# Patient Record
Sex: Male | Born: 1970 | Race: Black or African American | Hispanic: No | Smoking: Current every day smoker
Health system: Southern US, Community
[De-identification: ages and names within clinical notes are randomized; demographics above are authoritative.]

---

## 2000-01-26 ENCOUNTER — Emergency Department (HOSPITAL_COMMUNITY): Admission: EM | Admit: 2000-01-26 | Discharge: 2000-01-27 | Payer: Self-pay | Admitting: Emergency Medicine

## 2000-01-27 ENCOUNTER — Encounter: Payer: Self-pay | Admitting: Emergency Medicine

## 2000-12-25 ENCOUNTER — Emergency Department (HOSPITAL_COMMUNITY): Admission: EM | Admit: 2000-12-25 | Discharge: 2000-12-26 | Payer: Self-pay | Admitting: Emergency Medicine

## 2002-08-03 ENCOUNTER — Encounter: Payer: Self-pay | Admitting: Emergency Medicine

## 2002-08-03 ENCOUNTER — Emergency Department (HOSPITAL_COMMUNITY): Admission: EM | Admit: 2002-08-03 | Discharge: 2002-08-03 | Payer: Self-pay | Admitting: Emergency Medicine

## 2002-11-17 ENCOUNTER — Encounter: Payer: Self-pay | Admitting: Emergency Medicine

## 2002-11-17 ENCOUNTER — Emergency Department (HOSPITAL_COMMUNITY): Admission: EM | Admit: 2002-11-17 | Discharge: 2002-11-17 | Payer: Self-pay | Admitting: *Deleted

## 2002-12-22 ENCOUNTER — Emergency Department (HOSPITAL_COMMUNITY): Admission: EM | Admit: 2002-12-22 | Discharge: 2002-12-22 | Payer: Self-pay | Admitting: Emergency Medicine

## 2003-06-05 ENCOUNTER — Emergency Department (HOSPITAL_COMMUNITY): Admission: EM | Admit: 2003-06-05 | Discharge: 2003-06-05 | Payer: Self-pay | Admitting: Emergency Medicine

## 2003-08-04 ENCOUNTER — Emergency Department (HOSPITAL_COMMUNITY): Admission: EM | Admit: 2003-08-04 | Discharge: 2003-08-04 | Payer: Self-pay | Admitting: Emergency Medicine

## 2003-08-25 ENCOUNTER — Emergency Department (HOSPITAL_COMMUNITY): Admission: EM | Admit: 2003-08-25 | Discharge: 2003-08-25 | Payer: Self-pay | Admitting: Emergency Medicine

## 2004-01-23 ENCOUNTER — Emergency Department (HOSPITAL_COMMUNITY): Admission: EM | Admit: 2004-01-23 | Discharge: 2004-01-23 | Payer: Self-pay

## 2006-09-27 ENCOUNTER — Emergency Department (HOSPITAL_COMMUNITY): Admission: EM | Admit: 2006-09-27 | Discharge: 2006-09-27 | Payer: Self-pay | Admitting: Emergency Medicine

## 2006-11-24 ENCOUNTER — Emergency Department (HOSPITAL_COMMUNITY): Admission: EM | Admit: 2006-11-24 | Discharge: 2006-11-24 | Payer: Self-pay | Admitting: Emergency Medicine

## 2007-04-27 ENCOUNTER — Emergency Department (HOSPITAL_COMMUNITY): Admission: EM | Admit: 2007-04-27 | Discharge: 2007-04-27 | Payer: Self-pay | Admitting: Emergency Medicine

## 2008-12-12 ENCOUNTER — Emergency Department (HOSPITAL_COMMUNITY): Admission: EM | Admit: 2008-12-12 | Discharge: 2008-12-12 | Payer: Self-pay | Admitting: Emergency Medicine

## 2009-09-02 ENCOUNTER — Emergency Department (HOSPITAL_COMMUNITY): Admission: EM | Admit: 2009-09-02 | Discharge: 2009-09-02 | Payer: Self-pay | Admitting: Emergency Medicine

## 2010-07-16 LAB — URINALYSIS, ROUTINE W REFLEX MICROSCOPIC
Bilirubin Urine: NEGATIVE
Hgb urine dipstick: NEGATIVE
Ketones, ur: NEGATIVE mg/dL
Urobilinogen, UA: 1 mg/dL (ref 0.0–1.0)

## 2010-07-16 LAB — DIFFERENTIAL
Lymphocytes Relative: 28 % (ref 12–46)
Monocytes Absolute: 0.5 10*3/uL (ref 0.1–1.0)
Monocytes Relative: 12 % (ref 3–12)
Neutro Abs: 2.7 10*3/uL (ref 1.7–7.7)
Neutrophils Relative %: 59 % (ref 43–77)

## 2010-07-16 LAB — COMPREHENSIVE METABOLIC PANEL
AST: 20 U/L (ref 0–37)
Albumin: 3.9 g/dL (ref 3.5–5.2)
Alkaline Phosphatase: 65 U/L (ref 39–117)
CO2: 26 mEq/L (ref 19–32)
GFR calc Af Amer: >60 mL/min (ref >60)
GFR calc non Af Amer: >60 mL/min (ref >60)
Potassium: 4.2 mEq/L (ref 3.5–5.1)
Sodium: 138 mEq/L (ref 135–145)
Total Bilirubin: 1.2 mg/dL (ref 0.3–1.2)

## 2010-07-16 LAB — URINE MICROSCOPIC-ADD ON

## 2010-07-16 LAB — CBC
HCT: 40.3 % (ref 39.0–52.0)
MCHC: 33.8 g/dL (ref 30.0–36.0)
MCV: 94.4 fL (ref 78.0–100.0)

## 2010-12-17 IMAGING — CR DG CHEST 2V
2 series · 2 of 2 positions shown · non-contrast
Comparison: 11/24/2006

CLINICAL DATA: Mid chest pain, shortness of breath

CHEST - 2 VIEW

[w chest pa]
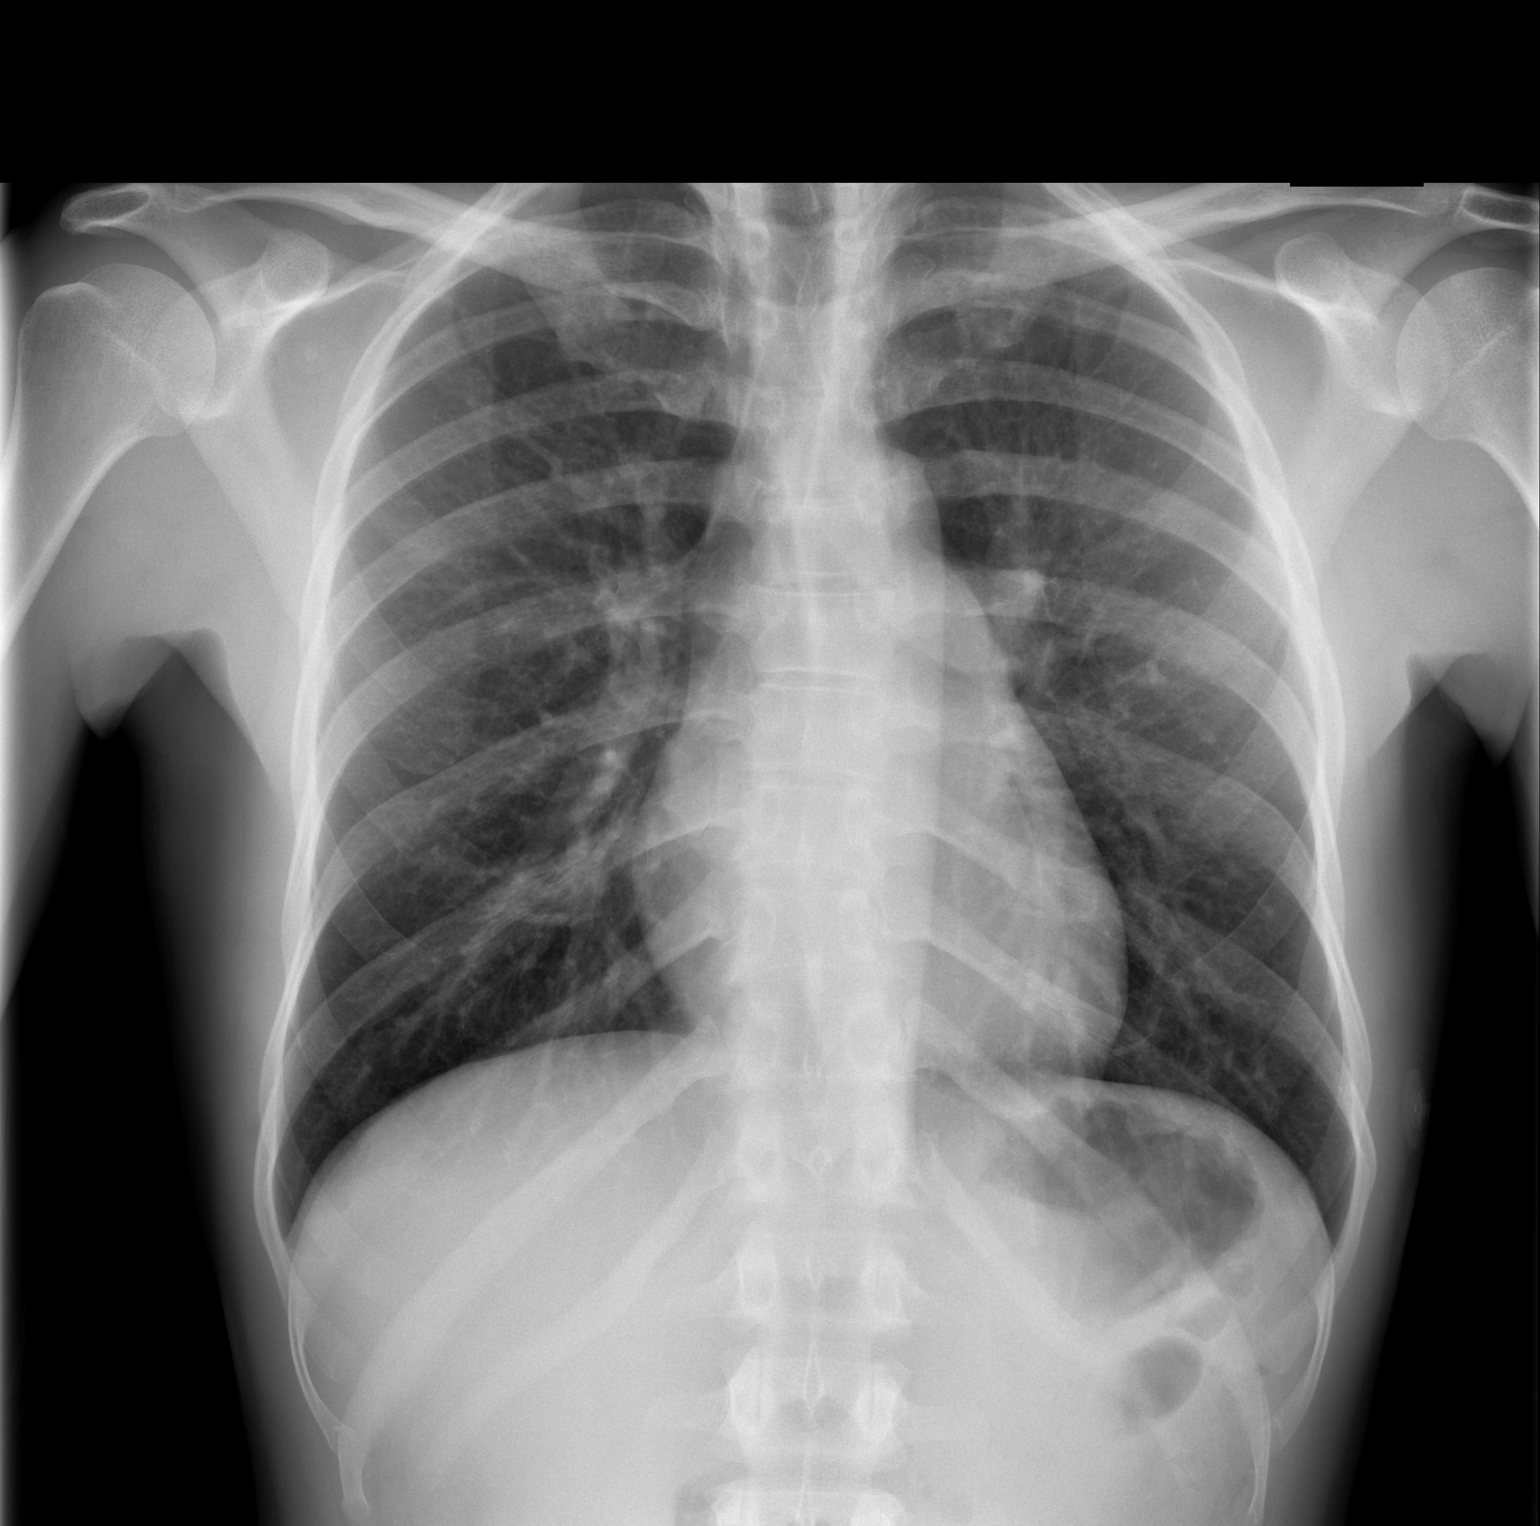

[w chest lat]
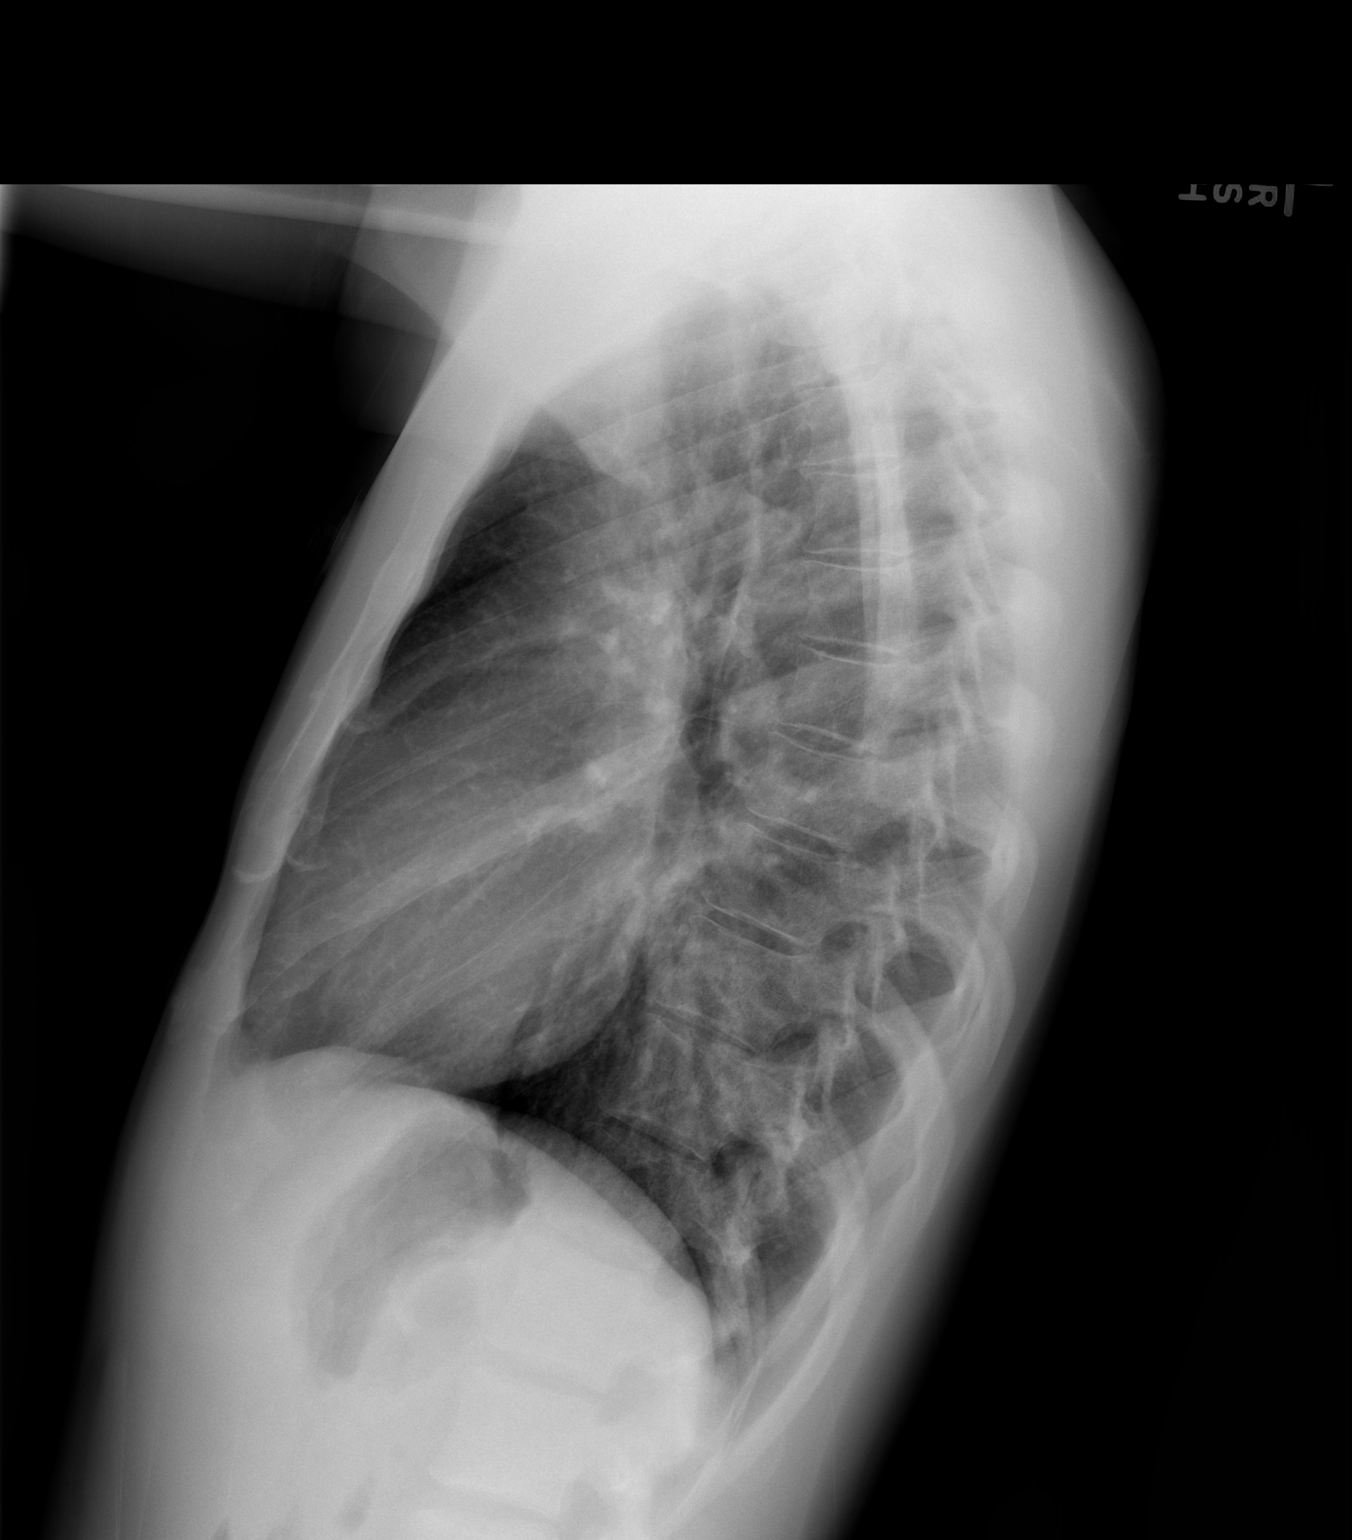

[2 of 2 positions shown; findings below may reference images not displayed]

FINDINGS: Upper normal heart size.
Normal mediastinal contours and pulmonary vascularity.
Minimal chronic peribronchial thickening.
No pulmonary infiltrate, pleural effusion, or pneumothorax.
No acute bony findings.
IMPRESSION: No acute abnormalities.

## 2010-12-30 LAB — DIFFERENTIAL
Eosinophils Relative: 0
Lymphocytes Relative: 43
Lymphs Abs: 2
Neutro Abs: 2.2
Neutrophils Relative %: 48

## 2010-12-30 LAB — COMPREHENSIVE METABOLIC PANEL
ALT: 15
BUN: 10
Chloride: 107
Creatinine, Ser: 1.03
GFR calc non Af Amer: >60
Glucose, Bld: 87
Potassium: 4
Total Bilirubin: 0.7

## 2010-12-30 LAB — URINALYSIS, ROUTINE W REFLEX MICROSCOPIC: Urobilinogen, UA: 1

## 2010-12-30 LAB — CBC
MCV: 90.2
Platelets: 285
RDW: 12.5
WBC: 4.6

## 2010-12-30 LAB — LIPASE, BLOOD: Lipase: 15

## 2011-01-21 LAB — I-STAT 8, (EC8 V) (CONVERTED LAB)
Bicarbonate: 22.6
Chloride: 109
Glucose, Bld: 84
Hemoglobin: 15.3
Operator id: 288831
Potassium: 3.9
Sodium: 144
TCO2: 24
pCO2, Ven: 43.3 — ABNORMAL LOW
pH, Ven: 7.326 — ABNORMAL HIGH

## 2011-01-21 LAB — CBC
HCT: 41.2
Hemoglobin: 14
MCHC: 34
MCV: 89.8
Platelets: 281
RBC: 4.59
RDW: 12.9
WBC: 5.1

## 2011-01-21 LAB — DIFFERENTIAL
Basophils Relative: 1
Eosinophils Absolute: 0
Eosinophils Relative: 0
Lymphocytes Relative: 33
Lymphs Abs: 1.7
Monocytes Relative: 9

## 2011-01-21 LAB — POCT I-STAT CREATININE
Creatinine, Ser: 1.1
Operator id: 288831

## 2011-01-26 LAB — DIFFERENTIAL
Basophils Relative: 2 — ABNORMAL HIGH
Monocytes Relative: 6
Neutro Abs: 4.5
Neutrophils Relative %: 62

## 2011-01-26 LAB — CBC
MCHC: 33.9
RBC: 4.78
WBC: 7.3

## 2011-01-26 LAB — I-STAT 8, (EC8 V) (CONVERTED LAB)
Acid-base deficit: 1
BUN: 11
Chloride: 109
Glucose, Bld: 92
Potassium: 4
pH, Ven: 7.392 — ABNORMAL HIGH

## 2014-10-18 ENCOUNTER — Emergency Department (HOSPITAL_COMMUNITY)
Admission: EM | Admit: 2014-10-18 | Discharge: 2014-10-18 | Disposition: A | Discharge status: Home / Self Care | Payer: Self-pay | Attending: Emergency Medicine | Admitting: Emergency Medicine

## 2014-10-18 ENCOUNTER — Encounter (HOSPITAL_COMMUNITY): Payer: Self-pay | Admitting: *Deleted

## 2014-10-18 DIAGNOSIS — Z720 Tobacco use: Secondary | ICD-10-CM | POA: Insufficient documentation

## 2014-10-18 DIAGNOSIS — L0291 Cutaneous abscess, unspecified: Secondary | ICD-10-CM

## 2014-10-18 DIAGNOSIS — L02219 Cutaneous abscess of trunk, unspecified: Secondary | ICD-10-CM | POA: Insufficient documentation

## 2014-10-18 MED ORDER — LIDOCAINE-EPINEPHRINE (PF) 2 %-1:200000 IJ SOLN
10.0000 mL | Freq: Once | INTRAMUSCULAR | Status: AC
  Administered 2014-10-18: 10 mL
  Filled 2014-10-18: qty 20

## 2014-10-18 NOTE — Discharge Instructions (Signed)
Abscess °An abscess is an infected area that contains a collection of pus and debris. It can occur in almost any part of the body. An abscess is also known as a furuncle or boil. °CAUSES  °An abscess occurs when tissue gets infected. This can occur from blockage of oil or sweat glands, infection of hair follicles, or a minor injury to the skin. As the body tries to fight the infection, pus collects in the area and creates pressure under the skin. This pressure causes pain. People with weakened immune systems have difficulty fighting infections and get certain abscesses more often.  °SYMPTOMS °Usually an abscess develops on the skin and becomes a painful mass that is red, warm, and tender. If the abscess forms under the skin, you may feel a moveable soft area under the skin. Some abscesses break open (rupture) on their own, but most will continue to get worse without care. The infection can spread deeper into the body and eventually into the bloodstream, causing you to feel ill.  °DIAGNOSIS  °Your caregiver will take your medical history and perform a physical exam. A sample of fluid may also be taken from the abscess to determine what is causing your infection. °TREATMENT  °Your caregiver may prescribe antibiotic medicines to fight the infection. However, taking antibiotics alone usually does not cure an abscess. Your caregiver may need to make a small cut (incision) in the abscess to drain the pus. In some cases, gauze is packed into the abscess to reduce pain and to continue draining the area. °HOME CARE INSTRUCTIONS  °· Only take over-the-counter or prescription medicines for pain, discomfort, or fever as directed by your caregiver. °· If you were prescribed antibiotics, take them as directed. Finish them even if you start to feel better. °· If gauze is used, follow your caregiver's directions for changing the gauze. °· To avoid spreading the infection: °· Keep your draining abscess covered with a  bandage. °· Wash your hands well. °· Do not share personal care items, towels, or whirlpools with others. °· Avoid skin contact with others. °· Keep your skin and clothes clean around the abscess. °· Keep all follow-up appointments as directed by your caregiver. °SEEK MEDICAL CARE IF:  °· You have increased pain, swelling, redness, fluid drainage, or bleeding. °· You have muscle aches, chills, or a general ill feeling. °· You have a fever. °MAKE SURE YOU:  °· Understand these instructions. °· Will watch your condition. °· Will get help right away if you are not doing well or get worse. °Document Released: 01/05/2005 Document Revised: 09/27/2011 Document Reviewed: 06/10/2011 °[employer]® Patient Information ©2015 [employer]. This information is not intended to replace advice given to you by your health care provider. Make sure you discuss any questions you have with your health care provider. ° °Abscess °Care After °An abscess (also called a boil or furuncle) is an infected area that contains a collection of pus. Signs and symptoms of an abscess include pain, tenderness, redness, or hardness, or you may feel a moveable soft area under your skin. An abscess can occur anywhere in the body. The infection may spread to surrounding tissues causing cellulitis. A cut (incision) by the surgeon was made over your abscess and the pus was drained out. Gauze may have been packed into the space to provide a drain that will allow the cavity to heal from the inside outwards. The boil may be painful for 5 to 7 days. Most people with a boil do not have   high fevers. Your abscess, if seen early, may not have localized, and may not have been lanced. If not, another appointment may be required for this if it does not get better on its own or with medications. °HOME CARE INSTRUCTIONS  °· Only take over-the-counter or prescription medicines for pain, discomfort, or fever as directed by your caregiver. °· When you bathe, soak and then  remove gauze or iodoform packs at least daily or as directed by your caregiver. You may then wash the wound gently with mild soapy water. Repack with gauze or do as your caregiver directs. °SEEK IMMEDIATE MEDICAL CARE IF:  °· You develop increased pain, swelling, redness, drainage, or bleeding in the wound site. °· You develop signs of generalized infection including muscle aches, chills, fever, or a general ill feeling. °· An oral temperature above 102° F (38.9° C) develops, not controlled by medication. °See your caregiver for a recheck if you develop any of the symptoms described above. If medications (antibiotics) were prescribed, take them as directed. °Document Released: 10/14/2004 Document Revised: 06/20/2011 Document Reviewed: 06/11/2007 °[employer]® Patient Information ©2015 [employer]. This information is not intended to replace advice given to you by your health care provider. Make sure you discuss any questions you have with your health care provider. ° °

## 2014-10-18 NOTE — ED Notes (Signed)
Pt reports abscess started 2 weeks ago.

## 2014-10-18 NOTE — ED Provider Notes (Signed)
CSN: [id number]     Arrival date & time 10/18/14  4098 History  This chart was scribed for Everlene Farrier, PA-C, working with Blake Divine, MD by Chestine Spore, ED Scribe. The patient was seen in room TR09C/TR09C at 9:10 AM.    Chief Complaint  Patient presents with  . Recurrent Skin Infections      The history is provided by the patient. No language interpreter was used.    HPI Comments: Edward George is a 44 y.o. male who presents to the Emergency Department complaining of recurrent skin infections onset 2 weeks ago. Pt reports that the bump was small initially and began to increase in size for the last week. Pt reports that the area tingles. Pt notes that he has had abscess in the past in 1999 that was on his left neck. He states that he has not tried anything to alleviate his symptoms. Pt denies fevers, chills, discharge, and any other symptoms. Pt denies any allergies to medications.    History reviewed. No pertinent past medical history. History reviewed. No pertinent past surgical history. History reviewed. No pertinent family history. History  Substance Use Topics  . Smoking status: Current Every Day Smoker -- 0.50 packs/day    Types: Cigarettes  . Smokeless tobacco: Never Used  . Alcohol Use: Not on file    Review of Systems  Constitutional: Negative for fever and chills.  Skin: Negative for wound.       Abscess to upper back with no drainage.      Allergies  Review of patient's allergies indicates no known allergies.  Home Medications   Prior to Admission medications   Not on File   BP 128/83 mmHg  Pulse 68  Temp(Src) 97.9 F (36.6 C) (Oral)  Resp 18  Ht  (1.753 m)  Wt 150 lb (68.04 kg)  BMI 22.14 kg/m2  SpO2 100% Physical Exam  Constitutional: He appears well-developed and well-nourished. No distress.  HENT:  Head: Normocephalic and atraumatic.  Eyes: Right eye exhibits no discharge. Left eye exhibits no discharge.  Pulmonary/Chest: Effort  normal. No respiratory distress.  Neurological: He is alert. Coordination normal.  Skin: Skin is warm and dry. He is not diaphoretic. No erythema.  2 cm mass to left lateral upper back. No overlying or surrounding erythema or discharge.   Psychiatric: He has a normal mood and affect. His behavior is normal.  Nursing note and vitals reviewed.   ED Course  INCISION AND DRAINAGE Date/Time: 10/18/2014 9:25 AM Performed by: Everlene Farrier Authorized by: Everlene Farrier Consent: Verbal consent obtained. Risks and benefits: risks, benefits and alternatives were discussed Consent given by: patient Patient understanding: patient states understanding of the procedure being performed Patient consent: the patient's understanding of the procedure matches consent given Procedure consent: procedure consent matches procedure scheduled Relevant documents: relevant documents present and verified Site marked: the operative site was marked Required items: required blood products, implants, devices, and special equipment available Patient identity confirmed: verbally with patient Time out: Immediately prior to procedure a "time out" was called to verify the correct patient, procedure, equipment, support staff and site/side marked as required. Type: abscess Body area: trunk Location details: back Anesthesia: local infiltration Local anesthetic: lidocaine 2% with epinephrine Anesthetic total: 1 ml Patient sedated: no Scalpel size: 11 Incision type: single straight Complexity: simple Drainage: purulent Drainage amount: copious Wound treatment: wound left open Packing material: none Patient tolerance: Patient tolerated the procedure well with no immediate complications   (including  critical care time) DIAGNOSTIC STUDIES: Oxygen Saturation is 100% on RA, nl by my interpretation.    COORDINATION OF CARE: 9:17 AM-Discussed treatment plan which includes I&D with pt at bedside and pt agreed to plan.    Labs Review Labs Reviewed - No data to display  Imaging Review No results found.   EKG Interpretation None      Filed Vitals:   10/18/14 0855  BP: 128/83  Pulse: 68  Temp: 97.9 F (36.6 C)  TempSrc: Oral  Resp: 18  Height: 5\' 9"  (1.753 m)  Weight: 150 lb (68.04 kg)  SpO2: 100%     MDM   Meds given in ED:  Medications  lidocaine-EPINEPHrine (XYLOCAINE W/EPI) 2 %-1:200000 (PF) injection 10 mL (10 mLs Infiltration Given by Other 10/18/14 0913)    New Prescriptions   No medications on file    Final diagnoses:  Abscess   This is a 44 year old male who presents to the emergency department complaining of an abscess to his left upper back ongoing for the past 2 weeks. I examined the patient is afebrile nontoxic appearing. The patient is a 2 cm indurated and fluctuant mass to his left upper back. There is superficial abscess. Abscess visualized under ultrasound prior to incision and drainage. Incision and drainage performed by me and tolerated well by the patient. Moderate amount of purulent drainage obtained. Dictation on wound care provided to the patient. I advised the patient to follow-up with their primary care provider this week. I advised the patient to return to the emergency department with new or worsening symptoms or new concerns. The patient verbalized understanding and agreement with plan.    EMERGENCY DEPARTMENT US SOFT TISSUE INTERPRETATION "Study: Limited Ultrasound of the noted body part in comments below"  INDICATIONS: Soft tissue infection Multiple views of the body part are obtained with a multi-frequency linear probe  PERFORMED BY:  Myself  IMAGES ARCHIVED?: Yes  SIDE:Left  BODY PART:Upper back  FINDINGS: Abcess present  LIMITATIONS:  None  INTERPRETATION:  Abcess present  COMMENT:  Superficial soft tissue abscess.   I personally performed the services described in this documentation, which was scribed in my presence. The recorded information  has been reviewed and is accurate.    Everlene FarrierWilliam Renell Coaxum, PA-C 10/18/14 [id number]  Blake DivineJohn Wofford, MD 10/18/14 [id number]

## 2021-06-22 ENCOUNTER — Ambulatory Visit: Payer: Self-pay | Admitting: *Deleted

## 2021-06-22 NOTE — Telephone Encounter (Signed)
Pt called reporting that he has broken a few of his teeth in the last few days, wants to talk to a nurse and discuss next steps for relief.  ? ? ?Attempted to reach pt at number provided. "Number is not in service."  ?

## 2021-06-22 NOTE — Telephone Encounter (Signed)
Summary: Broken Teeth, seeking advice  ? Best contact: [phone]  ?Pt called reporting that he has broken a few of his teeth in the last few days, wants to talk to a nurse and discuss next steps for relief.   ?  ? ?Chief Complaint: teeth on lower right side breaking, painful ?Symptoms: pain, slight swelling ?Frequency: when pain med wears off ?Pertinent Negatives: Patient denies fever ?Disposition: [x] ED /[x] Urgent Care (no appt availability in office) / [] Appointment(In office/virtual)/ []  [facility]/ [] Home Care/ [] Refused Recommended Disposition /[] [facility]/ []  Follow-up with PCP ?Additional Notes: Pt does not have PCP, works in , severe pain especially if air hits teeth, advised UC or ED, gets off work at , states will go to . ? ? ?Reason for Disposition ? [1] Face is swollen AND [2] no fever ? ?Answer Assessment - Initial Assessment Questions ?1. LOCATION: "Which tooth is hurting?"  (e.g., right-side/left-side, upper/lower, front/back) ?    Lower right ?2. ONSET: "When did the toothache start?"  (e.g., hours, days)  ?    Comes and goes, bad now ?3. SEVERITY: "How bad is the toothache?"  (Scale 1-10; mild, moderate or severe) ?  - MILD (1-3): doesn't interfere with chewing  ?  - MODERATE (4-7): interferes with chewing, interferes with normal activities, awakens from sleep   ?  - SEVERE (8-10): unable to eat, unable to do any normal activities, excruciating pain    ?    10, unbearable, like a sharp pain shoots to side of face ?4. SWELLING: "Is there any visible swelling of your face?" ?    A little swelling ?5. OTHER SYMPTOMS: "Do you have any other symptoms?" (e.g., fever) ?    No fever ?6. PREGNANCY: "Is there any chance you are pregnant?" "When was your last menstrual period?" ?    na ? ?Protocols used: Toothache-A-AH ? ?
